# Patient Record
Sex: Male | Born: 1975 | Race: White | Hispanic: No | Marital: Single | Smoking: Former smoker
Health system: Southern US, Community
[De-identification: ages and names within clinical notes are randomized; demographics above are authoritative.]

## PROBLEM LIST (undated history)

## (undated) DIAGNOSIS — D6851 Activated protein C resistance: Secondary | ICD-10-CM

## (undated) DIAGNOSIS — M549 Dorsalgia, unspecified: Secondary | ICD-10-CM

## (undated) DIAGNOSIS — I341 Nonrheumatic mitral (valve) prolapse: Secondary | ICD-10-CM

## (undated) DIAGNOSIS — F419 Anxiety disorder, unspecified: Secondary | ICD-10-CM

## (undated) HISTORY — PX: INGUINAL HERNIA REPAIR: SUR1180

---

## 2008-01-17 ENCOUNTER — Ambulatory Visit: Payer: Self-pay | Admitting: Internal Medicine

## 2009-09-06 ENCOUNTER — Emergency Department: Payer: Self-pay | Admitting: Emergency Medicine

## 2009-09-06 ENCOUNTER — Ambulatory Visit: Payer: Self-pay | Admitting: Internal Medicine

## 2009-11-21 ENCOUNTER — Ambulatory Visit: Payer: Self-pay | Admitting: Internal Medicine

## 2010-09-30 ENCOUNTER — Ambulatory Visit: Payer: Self-pay | Admitting: Family Medicine

## 2011-04-28 ENCOUNTER — Ambulatory Visit: Payer: Self-pay | Admitting: Internal Medicine

## 2011-09-02 ENCOUNTER — Ambulatory Visit: Payer: Self-pay

## 2012-01-08 ENCOUNTER — Ambulatory Visit: Payer: Self-pay

## 2012-05-12 ENCOUNTER — Ambulatory Visit: Payer: Self-pay | Admitting: Medical

## 2012-10-16 ENCOUNTER — Ambulatory Visit: Payer: Self-pay | Admitting: Physician Assistant

## 2012-10-19 ENCOUNTER — Ambulatory Visit: Payer: Self-pay | Admitting: Family Medicine

## 2012-10-21 ENCOUNTER — Emergency Department: Payer: Self-pay | Admitting: Emergency Medicine

## 2013-08-03 ENCOUNTER — Emergency Department: Payer: Self-pay | Admitting: Emergency Medicine

## 2013-08-03 LAB — COMPREHENSIVE METABOLIC PANEL
Alkaline Phosphatase: 101 U/L (ref 50–136)
Anion Gap: 6 — ABNORMAL LOW (ref 7–16)
BUN: 11 mg/dL (ref 7–18)
Bilirubin,Total: 0.3 mg/dL (ref 0.2–1.0)
Calcium, Total: 8.9 mg/dL (ref 8.5–10.1)
Co2: 25 mmol/L (ref 21–32)
EGFR (African American): >60
Glucose: 126 mg/dL — ABNORMAL HIGH (ref 65–99)
Osmolality: 277 (ref 275–301)
Potassium: 3.4 mmol/L — ABNORMAL LOW (ref 3.5–5.1)
SGPT (ALT): 21 U/L (ref 12–78)
Total Protein: 6.9 g/dL (ref 6.4–8.2)

## 2013-08-03 LAB — CK TOTAL AND CKMB (NOT AT ARMC)
CK, Total: 67 U/L (ref 35–232)
CK-MB: 1 ng/mL (ref 0.5–3.6)

## 2013-08-03 LAB — PROTIME-INR: Prothrombin Time: 12.5 secs (ref 11.5–14.7)

## 2013-08-03 LAB — CBC
MCH: 31.1 pg (ref 26.0–34.0)
MCHC: 34.4 g/dL (ref 32.0–36.0)
MCV: 90 fL (ref 80–100)
Platelet: 281 10*3/uL (ref 150–440)
RBC: 4.74 10*6/uL (ref 4.40–5.90)
WBC: 7.5 10*3/uL (ref 3.8–10.6)

## 2013-12-30 ENCOUNTER — Emergency Department: Payer: Self-pay | Admitting: Emergency Medicine

## 2015-06-12 ENCOUNTER — Emergency Department
Admission: EM | Admit: 2015-06-12 | Discharge: 2015-06-12 | Disposition: A | Discharge status: Home / Self Care | Payer: Self-pay | Attending: Emergency Medicine | Admitting: Emergency Medicine

## 2015-06-12 ENCOUNTER — Encounter: Payer: Self-pay | Admitting: Emergency Medicine

## 2015-06-12 ENCOUNTER — Emergency Department: Payer: Self-pay

## 2015-06-12 ENCOUNTER — Other Ambulatory Visit: Payer: Self-pay

## 2015-06-12 DIAGNOSIS — R079 Chest pain, unspecified: Secondary | ICD-10-CM | POA: Insufficient documentation

## 2015-06-12 DIAGNOSIS — Z87891 Personal history of nicotine dependence: Secondary | ICD-10-CM | POA: Insufficient documentation

## 2015-06-12 DIAGNOSIS — F419 Anxiety disorder, unspecified: Secondary | ICD-10-CM | POA: Insufficient documentation

## 2015-06-12 HISTORY — DX: Anxiety disorder, unspecified: F41.9

## 2015-06-12 HISTORY — DX: Dorsalgia, unspecified: M54.9

## 2015-06-12 HISTORY — DX: Nonrheumatic mitral (valve) prolapse: I34.1

## 2015-06-12 LAB — TROPONIN I
Troponin I: <0.03 ng/mL (ref <0.031)
Troponin I: <0.03 ng/mL (ref <0.031)

## 2015-06-12 LAB — CBC WITH DIFFERENTIAL/PLATELET
Basophils Absolute: 0 10*3/uL (ref 0–0.1)
Basophils Relative: 0 %
Eosinophils Absolute: 0 10*3/uL (ref 0–0.7)
Eosinophils Relative: 0 %
HEMATOCRIT: 43.8 % (ref 40.0–52.0)
HEMOGLOBIN: 14.6 g/dL (ref 13.0–18.0)
LYMPHS ABS: 0.7 10*3/uL — AB (ref 1.0–3.6)
LYMPHS PCT: 5 %
MCH: 30.1 pg (ref 26.0–34.0)
MCHC: 33.4 g/dL (ref 32.0–36.0)
MCV: 90.1 fL (ref 80.0–100.0)
MONOS PCT: 5 %
Monocytes Absolute: 0.6 10*3/uL (ref 0.2–1.0)
NEUTROS ABS: 12.5 10*3/uL — AB (ref 1.4–6.5)
NEUTROS PCT: 90 %
Platelets: 287 10*3/uL (ref 150–440)
RBC: 4.86 MIL/uL (ref 4.40–5.90)
RDW: 12.7 % (ref 11.5–14.5)
WBC: 13.9 10*3/uL — AB (ref 3.8–10.6)

## 2015-06-12 LAB — CBC
HEMATOCRIT: 44.1 % (ref 40.0–52.0)
HEMOGLOBIN: 14.7 g/dL (ref 13.0–18.0)
MCH: 30.3 pg (ref 26.0–34.0)
MCHC: 33.5 g/dL (ref 32.0–36.0)
MCV: 90.6 fL (ref 80.0–100.0)
Platelets: 294 10*3/uL (ref 150–440)
RBC: 4.86 MIL/uL (ref 4.40–5.90)
RDW: 12.9 % (ref 11.5–14.5)
WBC: 14.4 10*3/uL — AB (ref 3.8–10.6)

## 2015-06-12 LAB — BASIC METABOLIC PANEL
ANION GAP: 9 (ref 5–15)
BUN: 14 mg/dL (ref 6–20)
CHLORIDE: 105 mmol/L (ref 101–111)
CO2: 24 mmol/L (ref 22–32)
Calcium: 9.4 mg/dL (ref 8.9–10.3)
Creatinine, Ser: 1 mg/dL (ref 0.61–1.24)
GFR calc Af Amer: >60 mL/min (ref >60)
Glucose, Bld: 125 mg/dL — ABNORMAL HIGH (ref 65–99)
POTASSIUM: 3.9 mmol/L (ref 3.5–5.1)
SODIUM: 138 mmol/L (ref 135–145)

## 2015-06-12 LAB — FIBRIN DERIVATIVES D-DIMER (ARMC ONLY): FIBRIN DERIVATIVES D-DIMER (ARMC): 232 (ref 0–499)

## 2015-06-12 MED ORDER — LORAZEPAM 2 MG/ML IJ SOLN
0.5000 mg | Freq: Once | INTRAMUSCULAR | Status: AC
  Administered 2015-06-12: 0.5 mg via INTRAVENOUS
  Filled 2015-06-12: qty 1

## 2015-06-12 NOTE — ED Provider Notes (Signed)
[facility] Emergency Department Provider Note  ____________________________________________  Time seen: Approximately 8:03 AM  I have reviewed the triage vital signs and the nursing notes.   HISTORY  Chief Complaint Chest Pain  History is difficult to obtain because patient mumbles and will not speak loud also he appears to be extremely anxious  HPI Rodney Perry is a 39 y.o. male who was at work when as I understand it the fire alarm went off this made him develop sharp stabbing chest pain in his left chest. It does not appear to radiate anywhere. He reports he is somewhat nauseated maybe a little bit short of breath at times as well. Patient reports the pain is sharp stabbing pain last seconds and goes away comes back immediately. This been going on for approximately an hour. I am Really unable to understand if this pain is ever happened before. I cannot determine if anything makes this pain better or worse except for the deep breathing seems to make it worse. Patient cannot tell me if it gets worse with exercise okaying it did not appear to be any other associated symptoms going on patient does not appear to have severe pain at this point   Past Medical History  Diagnosis Date  . Anxiety   . Back pain   . Mitral valve prolapse     There are no active problems to display for this patient.   Past Surgical History  Procedure Laterality Date  . Inguinal hernia repair      No current outpatient prescriptions on file.  Allergies Review of patient's allergies indicates no known allergies.  History reviewed. No pertinent family history.  Social History Social History  Substance Use Topics  . Smoking status: Former Games developer  . Smokeless tobacco: None  . Alcohol Use: No    Review of Systems Constitutional: No fever/chills Eyes: No visual changes. ENT: No sore throat. Cardiovascular: See history of present illness Respiratory: See history of  present illness. Gastrointestinal: No abdominal pain.  No nausea, no vomiting.  No diarrhea.  No constipation.  Musculoskeletal: See past medical history Skin: Negative for rash.  10-point ROS otherwise negative.  ____________________________________________   PHYSICAL EXAM:  VITAL SIGNS: ED Triage Vitals  Enc Vitals Group     BP 06/12/15 0743 118/86 mmHg     Pulse Rate 06/12/15 0743 115     Resp 06/12/15 0743 28     Temp 06/12/15 0743 98.4 F (36.9 C)     Temp Source 06/12/15 0743 Oral     SpO2 06/12/15 0743 100 %     Weight 06/12/15 0743 130 lb (58.968 kg)     Height 06/12/15 0743  (1.651 m)     Head Cir --      Peak Flow --      Pain Score 06/12/15 0745 5     Pain Loc --      Pain Edu? --      Excl. in GC? --     Constitutional: Alert and oriented. Appears anxious. Eyes: Conjunctivae are normal. PERRL. EOMI. Head: Atraumatic. Nose: No congestion/rhinnorhea. Mouth/Throat: Mucous membranes are moist.   Neck: No stridor. Cardiovascular: Normal rate, regular rhythm. Grossly normal heart sounds.  Good peripheral circulation. Respiratory: Normal respiratory effort.  No retractions. Lungs CTAB there are no rubs. Near as I can tell the palpation of the patient's chest reproduces the pain but the patient is very hesitant and really does not seem sure himself Gastrointestinal: Soft  and nontender. No distention. No abdominal bruits. No CVA tenderness. Musculoskeletal: No lower extremity tenderness trace edema bilaterally.  No joint effusions. Neurologic:  Normal speech and language. No gross focal neurologic deficits are appreciated.  Skin:  Skin is warm, dry and intact. No rash noted.   ____________________________________________   LABS (all labs ordered are listed, but only abnormal results are displayed)  Labs Reviewed  BASIC METABOLIC PANEL - Abnormal; Notable for the following:    Glucose, Bld 125 (*)    All other components within normal limits  CBC -  Abnormal; Notable for the following:    WBC 14.4 (*)    All other components within normal limits  CBC WITH DIFFERENTIAL/PLATELET - Abnormal; Notable for the following:    WBC 13.9 (*)    Neutro Abs 12.5 (*)    Lymphs Abs 0.7 (*)    All other components within normal limits  TROPONIN I  FIBRIN DERIVATIVES D-DIMER ([facility] ONLY)  TROPONIN I   ____________________________________________  EKG  EKG shows normal sinus rhythm normal axis QRS duration is 0.92 ms. Computer is reading incomplete right bundle branch block and think because there is no RSR prime in lead 1. Patient becomes increasingly minimally tachycardic as I talk to him and palpate his chest. Her rate goes from approximately 103-111 in the course the examination is back down and some leaving the room. ____________________________________________  RADIOLOGY  Radiology reads the film was increased. Bronchial cuffing. He reports that this has been present in the past. ____________________________________________   PROCEDURES   ____________________________________________   INITIAL IMPRESSION / ASSESSMENT AND PLAN / ED COURSE  Pertinent labs & imaging results that were available during my care of the patient were reviewed by me and considered in my medical decision making (see chart for details). Patient's d-dimer is negative patient's troponins are negative patient's pain is more pleuritic in nature. I will have her follow up with cardiology.  ____________________________________________   FINAL CLINICAL IMPRESSION(S) / ED DIAGNOSES  Final diagnoses:  Chest pain, unspecified chest pain type      Arnaldo Natal, MD 06/12/15 1234

## 2015-06-12 NOTE — ED Notes (Signed)
Patient transported to X-ray 

## 2015-06-12 NOTE — Discharge Instructions (Signed)
Chest Pain (Nonspecific) °It is often hard to give a specific diagnosis for the cause of chest pain. There is always a chance that your pain could be related to something serious, such as a heart attack or a blood clot in the lungs. You need to follow up with your health care provider for further evaluation. °CAUSES  °· Heartburn. °· Pneumonia or bronchitis. °· Anxiety or stress. °· Inflammation around your heart (pericarditis) or lung (pleuritis or pleurisy). °· A blood clot in the lung. °· A collapsed lung (pneumothorax). It can develop suddenly on its own (spontaneous pneumothorax) or from trauma to the chest. °· Shingles infection (herpes zoster virus). °The chest wall is composed of bones, muscles, and cartilage. Any of these can be the source of the pain. °· The bones can be bruised by injury. °· The muscles or cartilage can be strained by coughing or overwork. °· The cartilage can be affected by inflammation and become sore (costochondritis). °DIAGNOSIS  °Lab tests or other studies may be needed to find the cause of your pain. Your health care provider may have you take a test called an ambulatory electrocardiogram (ECG). An ECG records your heartbeat patterns over a 24-hour period. You may also have other tests, such as: °· Transthoracic echocardiogram (TTE). During echocardiography, sound waves are used to evaluate how blood flows through your heart. °· Transesophageal echocardiogram (TEE). °· Cardiac monitoring. This allows your health care provider to monitor your heart rate and rhythm in real time. °· Holter monitor. This is a portable device that records your heartbeat and can help diagnose heart arrhythmias. It allows your health care provider to track your heart activity for several days, if needed. °· Stress tests by exercise or by giving medicine that makes the heart beat faster. °TREATMENT  °· Treatment depends on what may be causing your chest pain. Treatment may include: °¨ Acid blockers for  heartburn. °¨ Anti-inflammatory medicine. °¨ Pain medicine for inflammatory conditions. °¨ Antibiotics if an infection is present. °· You may be advised to change lifestyle habits. This includes stopping smoking and avoiding alcohol, caffeine, and chocolate. °· You may be advised to keep your head raised (elevated) when sleeping. This reduces the chance of acid going backward from your stomach into your esophagus. °Most of the time, nonspecific chest pain will improve within 2-3 days with rest and mild pain medicine.  °HOME CARE INSTRUCTIONS  °· If antibiotics were prescribed, take them as directed. Finish them even if you start to feel better. °· For the next few days, avoid physical activities that bring on chest pain. Continue physical activities as directed. °· Do not use any tobacco products, including cigarettes, chewing tobacco, or electronic cigarettes. °· Avoid drinking alcohol. °· Only take medicine as directed by your health care provider. °· Follow your health care provider's suggestions for further testing if your chest pain does not go away. °· Keep any follow-up appointments you made. If you do not go to an appointment, you could develop lasting (chronic) problems with pain. If there is any problem keeping an appointment, call to reschedule. °SEEK MEDICAL CARE IF:  °· Your chest pain does not go away, even after treatment. °· You have a rash with blisters on your chest. °· You have a fever. °SEEK IMMEDIATE MEDICAL CARE IF:  °· You have increased chest pain or pain that spreads to your arm, neck, jaw, back, or abdomen. °· You have shortness of breath. °· You have an increasing cough, or you cough   up blood.  You have severe back or abdominal pain.  You feel nauseous or vomit.  You have severe weakness.  You faint.  You have chills. This is an emergency. Do not wait to see if the pain will go away. Get medical help at once. Call your local emergency services (911 in [place] Do not drive  yourself to the hospital. MAKE SURE YOU:   Understand these instructions.  Will watch your condition.  Will get help right away if you are not doing well or get worse. Document Released: 07/01/2005 Document Revised: 09/26/2013 Document Reviewed: 04/26/2008 [facility] Patient Information 2015 [place], [place]. This information is not intended to replace advice given to you by your health care provider. Make sure you discuss any questions you have with your health care provider. Please follow-up with Dr. coloscopy the cardiologist call his office later today for an appointment probably tomorrow. Please return for fever or productive cough or feeling worse.

## 2016-11-16 ENCOUNTER — Encounter: Payer: Self-pay | Admitting: Emergency Medicine

## 2016-11-16 ENCOUNTER — Emergency Department
Admission: EM | Admit: 2016-11-16 | Discharge: 2016-11-16 | Disposition: A | Discharge status: Home / Self Care | Payer: Self-pay | Attending: Emergency Medicine | Admitting: Emergency Medicine

## 2016-11-16 ENCOUNTER — Emergency Department: Payer: Self-pay

## 2016-11-16 DIAGNOSIS — Z87891 Personal history of nicotine dependence: Secondary | ICD-10-CM | POA: Insufficient documentation

## 2016-11-16 DIAGNOSIS — R55 Syncope and collapse: Secondary | ICD-10-CM | POA: Insufficient documentation

## 2016-11-16 HISTORY — DX: Activated protein C resistance: D68.51

## 2016-11-16 LAB — CBC
HCT: 43.2 % (ref 40.0–52.0)
Hemoglobin: 15.3 g/dL (ref 13.0–18.0)
MCH: 32 pg (ref 26.0–34.0)
MCHC: 35.5 g/dL (ref 32.0–36.0)
MCV: 90.3 fL (ref 80.0–100.0)
PLATELETS: 359 10*3/uL (ref 150–440)
RBC: 4.78 MIL/uL (ref 4.40–5.90)
RDW: 13.3 % (ref 11.5–14.5)
WBC: 10.3 10*3/uL (ref 3.8–10.6)

## 2016-11-16 LAB — COMPREHENSIVE METABOLIC PANEL
ALBUMIN: 4.4 g/dL (ref 3.5–5.0)
ALT: 13 U/L — ABNORMAL LOW (ref 17–63)
ANION GAP: 7 (ref 5–15)
AST: 20 U/L (ref 15–41)
Alkaline Phosphatase: 101 U/L (ref 38–126)
BUN: 16 mg/dL (ref 6–20)
CALCIUM: 9.2 mg/dL (ref 8.9–10.3)
CHLORIDE: 104 mmol/L (ref 101–111)
CO2: 24 mmol/L (ref 22–32)
Creatinine, Ser: 1.26 mg/dL — ABNORMAL HIGH (ref 0.61–1.24)
GFR calc non Af Amer: >60 mL/min (ref >60)
Glucose, Bld: 150 mg/dL — ABNORMAL HIGH (ref 65–99)
POTASSIUM: 4.1 mmol/L (ref 3.5–5.1)
Sodium: 135 mmol/L (ref 135–145)
Total Bilirubin: 0.7 mg/dL (ref 0.3–1.2)
Total Protein: 7.4 g/dL (ref 6.5–8.1)

## 2016-11-16 LAB — TROPONIN I
Troponin I: <0.03 ng/mL (ref <0.03)
Troponin I: <0.03 ng/mL (ref <0.03)

## 2016-11-16 MED ORDER — SODIUM CHLORIDE 0.9 % IV BOLUS (SEPSIS)
1000.0000 mL | Freq: Once | INTRAVENOUS | Status: AC
  Administered 2016-11-16: 1000 mL via INTRAVENOUS

## 2016-11-16 NOTE — ED Notes (Signed)
Pt. Able to get up from bed and ambulate to toilet.

## 2016-11-16 NOTE — ED Triage Notes (Signed)
Pt arrives via [facility] with c/o syncopal episode at work. Pt states that he was standing at work and passed out hitting his head on the monitor. Pt also reports that he almost passed out a second time. Per EMS, pt has CBG of 220 and VS WDL. Pt is in NAD at this time.

## 2016-11-16 NOTE — ED Notes (Signed)
Pt. Going home with mother. 

## 2016-11-16 NOTE — ED Provider Notes (Signed)
[facility] Emergency Department Provider Note   ____________________________________________   First MD Initiated Contact with Patient 11/16/16 0117     (approximate)  I have reviewed the triage vital signs and the nursing notes.   HISTORY  Chief Complaint Loss of Consciousness    HPI Rodney Perry is a 41 y.o. male who comes into the hospital today not feeling well. He reports that he was at work standing when he started feeling lightheaded and sweaty. He reports he passed out. He woke up when he hit his head on the monitor. He states that he started feeling like a pass out again but he sat down before he passed out. The patient reports that he is unsure exactly how long he was out for. He states that he has been eating and drinking well today. He reports that he had no chest pain no shortness of breath no nausea vomiting or dizziness. He said the last time something like this happen in his heart was racing but he didn't pass out. He has never passed out before. The patient has a history of factor V Leiden but he takes aspirin as never had any clots before. The patient was at work when this occurred. He is here today for evaluation.   Past Medical History:  Diagnosis Date  . Anxiety   . Back pain   . Factor V Leiden (HCC)   . Mitral valve prolapse     There are no active problems to display for this patient.   Past Surgical History:  Procedure Laterality Date  . INGUINAL HERNIA REPAIR      Prior to Admission medications   Not on File    Allergies Patient has no known allergies.  No family history on file.  Social History Social History  Substance Use Topics  . Smoking status: Former Games developer  . Smokeless tobacco: Never Used  . Alcohol use No    Review of Systems Constitutional: No fever/chills Eyes: No visual changes. ENT: No sore throat. Cardiovascular: Denies chest pain. Respiratory: Denies shortness of  breath. Gastrointestinal: No abdominal pain.  No nausea, no vomiting.  No diarrhea.  No constipation. Genitourinary: Negative for dysuria. Musculoskeletal: Negative for back pain. Skin: Negative for rash. Neurological: Syncope, dizziness, lightheadedness.  10-point ROS otherwise negative.  ____________________________________________   PHYSICAL EXAM:  VITAL SIGNS: ED Triage Vitals [11/16/16 0125]  Enc Vitals Group     BP 130/67     Pulse Rate 93     Resp 15     Temp 98.4 F (36.9 C)     Temp Source Oral     SpO2 100 %     Weight 135 lb (61.2 kg)     Height 5\' 5"  (1.651 m)     Head Circumference      Peak Flow      Pain Score 1     Pain Loc      Pain Edu?      Excl. in GC?     Constitutional: Alert and oriented. Well appearing and in no acute distress. Eyes: Conjunctivae are normal. PERRL. EOMI. Head: Atraumatic. Nose: No congestion/rhinnorhea. Mouth/Throat: Mucous membranes are moist.  Oropharynx non-erythematous. Cardiovascular: Normal rate, regular rhythm. Grossly normal heart sounds.  Good peripheral circulation. Respiratory: Normal respiratory effort.  No retractions. Lungs CTAB. Gastrointestinal: Soft and nontender. No distention. Positive bowel sounds Musculoskeletal: No lower extremity tenderness nor edema.   Neurologic:  Normal speech and language. Cranial nerves II through XII  are grossly intact with no focal motor or neuro deficits Skin:  Skin is warm, dry and intact.  Psychiatric: Mood and affect are normal.   ____________________________________________   LABS (all labs ordered are listed, but only abnormal results are displayed)  Labs Reviewed  COMPREHENSIVE METABOLIC PANEL - Abnormal; Notable for the following:       Result Value   Glucose, Bld 150 (*)    Creatinine, Ser 1.26 (*)    ALT 13 (*)    All other components within normal limits  CBC  TROPONIN I  TROPONIN I   ____________________________________________  EKG  ED ECG REPORT I,  Rebecka ApleyWebster,  Ruqayyah Lute P, the attending physician, personally viewed and interpreted this ECG.   Date: 11/16/2016  EKG Time: 120  Rate: 98  Rhythm: normal sinus rhythm  Axis: normal  Intervals:none  ST&T Change: none  ____________________________________________  RADIOLOGY  CT head  ____________________________________________   PROCEDURES  Procedure(s) performed: None  Procedures  Critical Care performed: No  ____________________________________________   INITIAL IMPRESSION / ASSESSMENT AND PLAN / ED COURSE  Pertinent labs & imaging results that were available during my care of the patient were reviewed by me and considered in my medical decision making (see chart for details).  This is a 41 year old male who comes into the hospital today with syncope. The patient was standing at work when he passed out. He reports that he was feeling unwell but when I asked him what that meant he said that he felt a little lightheaded. He states that he did eat and drink well but he was not fully sure. I did give the patient liter of normal saline. Also check some blood work. The patient's blood work is unremarkable. He has no complaints at this time. He has some mildly elevated creatinine which could indicate some mild dehydration. After giving the patient fluids reports that he does feel little bit better. He'll be discharged home to follow-up with the acute care clinic.  Clinical Course as of Nov 16 624  Mon Nov 16, 2016  0219 No acute intracranial abnormalities. CT Head Wo Contrast [AW]    Clinical Course User Index [AW] Rebecka ApleyAllison P Iliyah Bui, MD     ____________________________________________   FINAL CLINICAL IMPRESSION(S) / ED DIAGNOSES  Final diagnoses:  Syncope, unspecified syncope type      NEW MEDICATIONS STARTED DURING THIS VISIT:  New Prescriptions   No medications on file     Note:  This document was prepared using Dragon voice recognition software and may  include unintentional dictation errors.    Rebecka ApleyAllison P Antoneo Ghrist, MD 11/16/16 [phone]

## 2016-11-16 NOTE — ED Notes (Signed)
Pt. States at around 23:00 last night pt. Had a syncope while sitting at desk.  Pt. States he was able to stand and get some water.  Pt. States when he sat back down he was dizzy again.  Pt. Denies hx of same.

## 2018-02-01 IMAGING — CT CT HEAD W/O CM
3 series · 16 of 47 positions shown, 19 images · non-contrast
Comparison: 12/31/2013

CLINICAL DATA: Syncopal episode. Struck head on monitor. Almost
passed out a second time.

EXAM:
CT HEAD WITHOUT CONTRAST
TECHNIQUE: Contiguous axial images were obtained from the base of the skull
through the vertex without intravenous contrast.

[Series 2: head wo · axial · 0.41mm/px · z∈[-157,-32]mm · 10 of 30 slices shown, 13 images]
[im 3/30  brain]
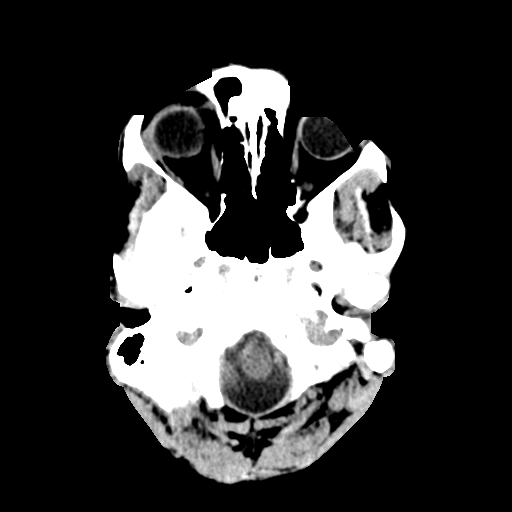
[im 3/30  bone]
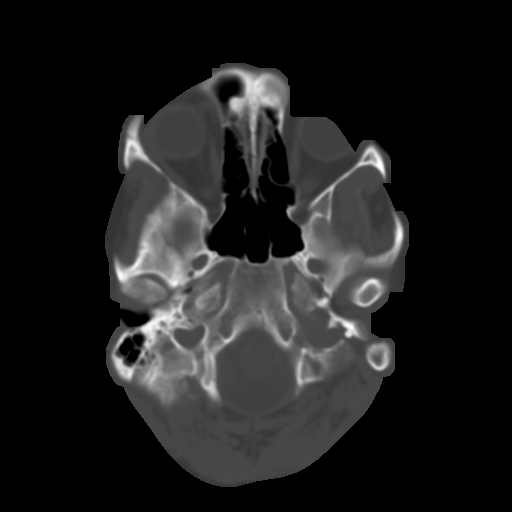
[im 6/30  brain]
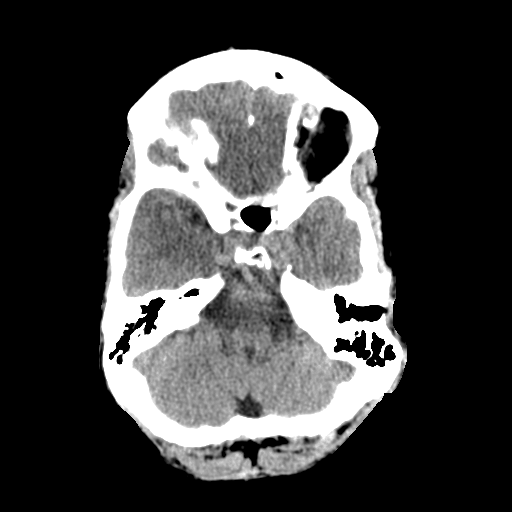
[im 9/30  brain]
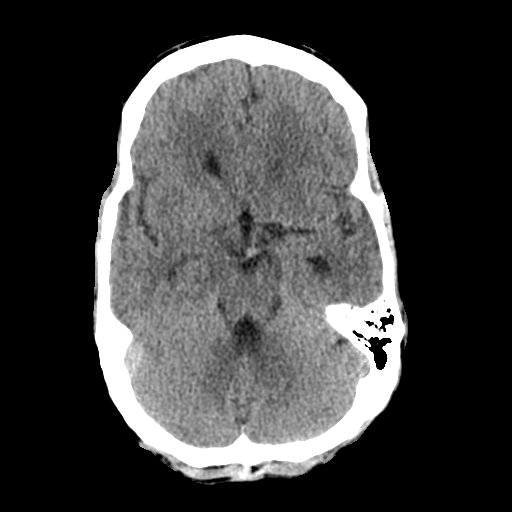
[im 11/30  brain]
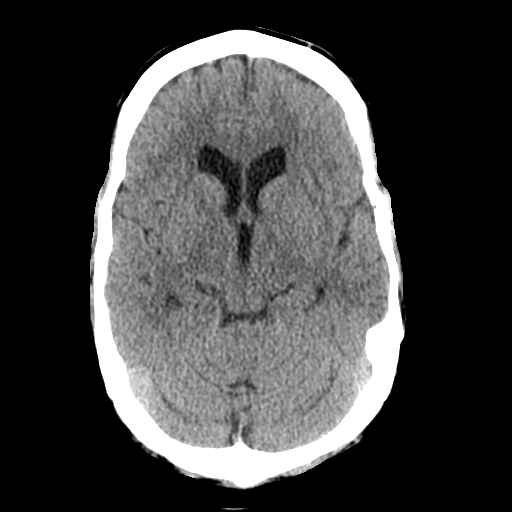
[im 14/30  brain]
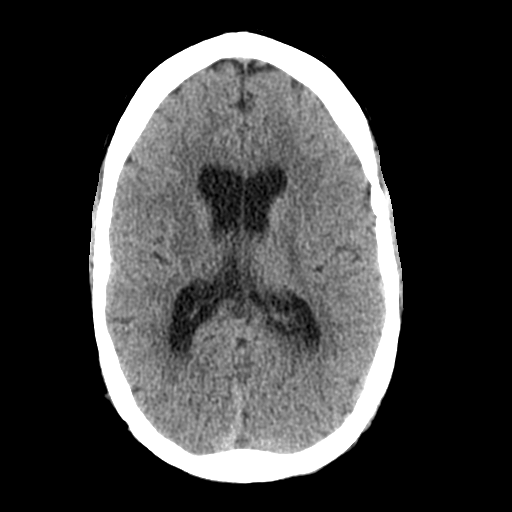
[im 14/30  bone]
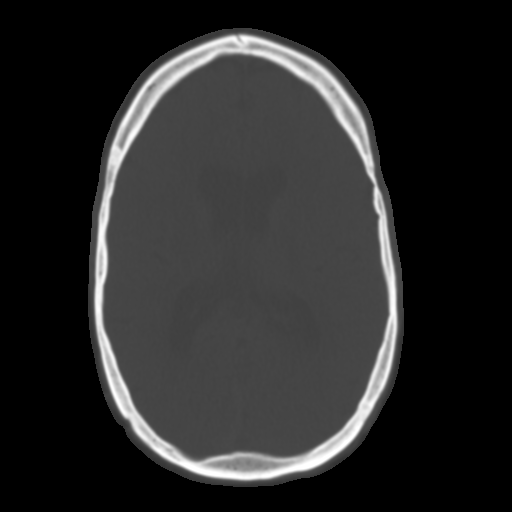
[im 17/30  brain]
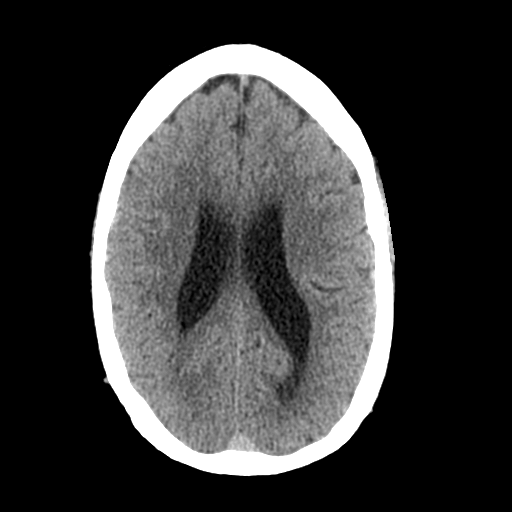
[im 20/30  brain]
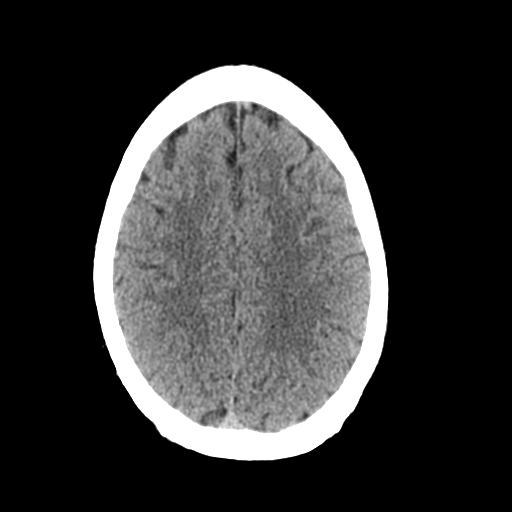
[im 23/30  brain]
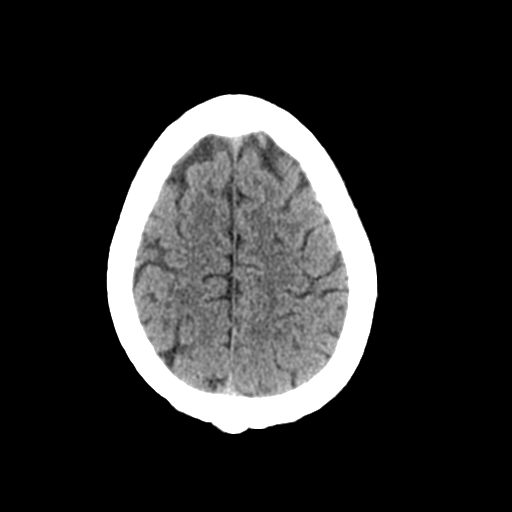
[im 25/30  brain]
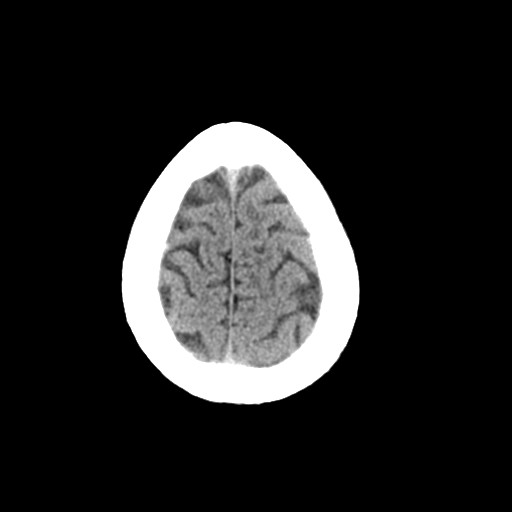
[im 25/30  bone]
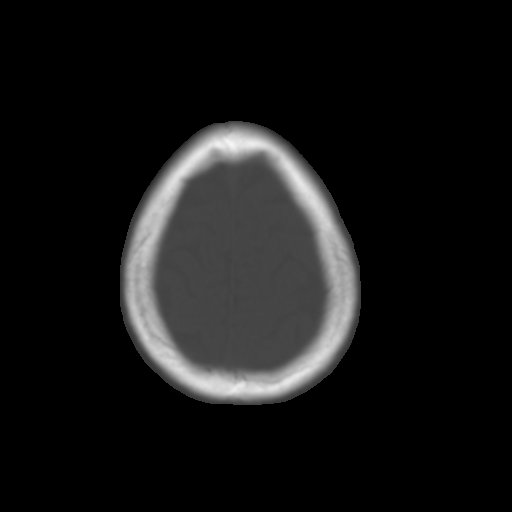
[im 28/30  brain]
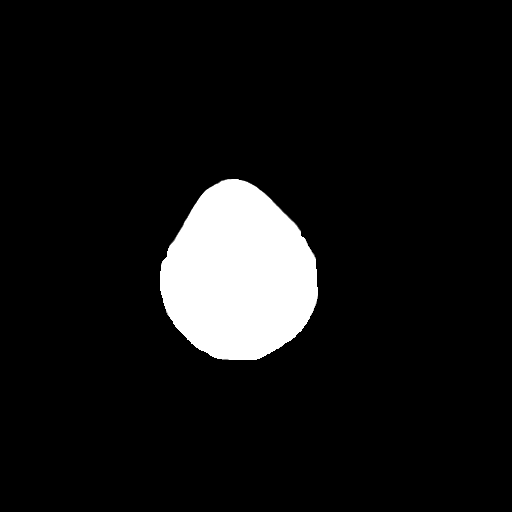

[Series 4: coronal soft tissue · coronal · 0.28mm/px · 3 of 65 slices shown]
[im 22/65  brain]
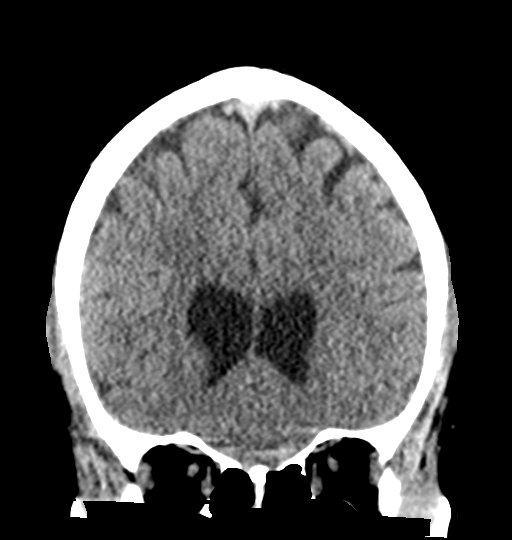
[im 29/65  brain]
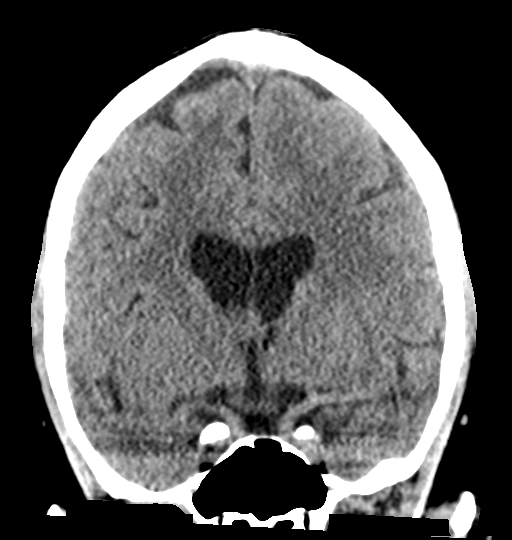
[im 36/65  brain]
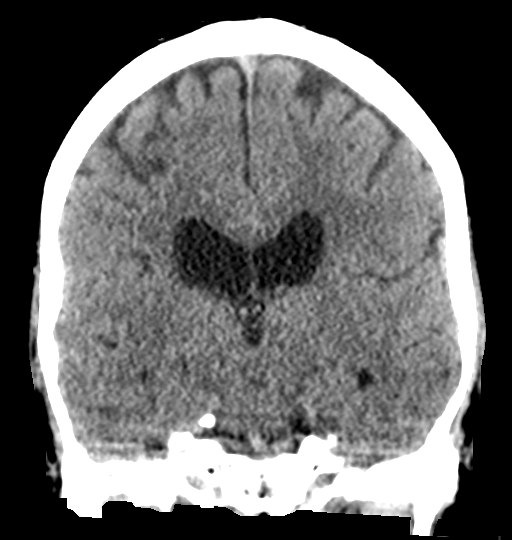

[Series 5: sagittal soft tissue · sagittal · 0.30mm/px · 3 of 49 slices shown]
[im 17/49  brain]
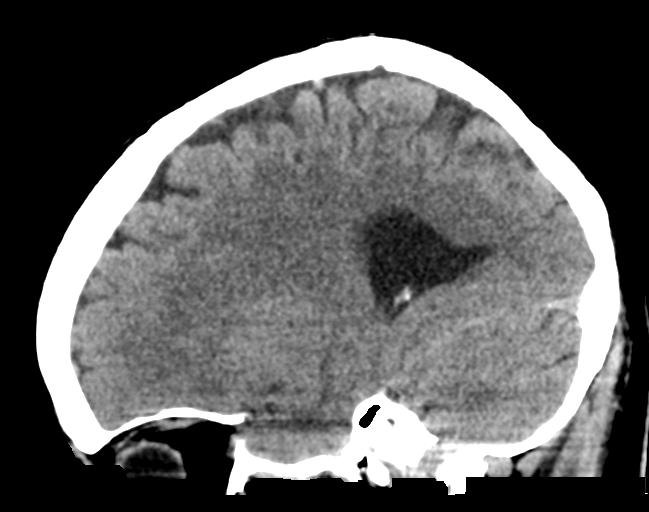
[im 25/49  brain]
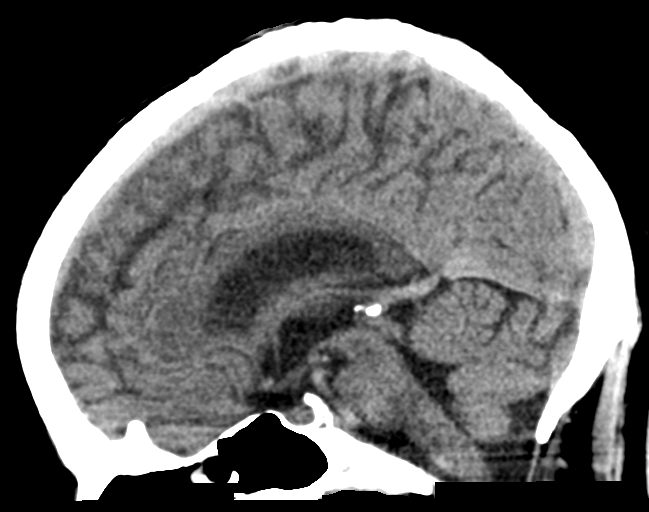
[im 33/49  brain]
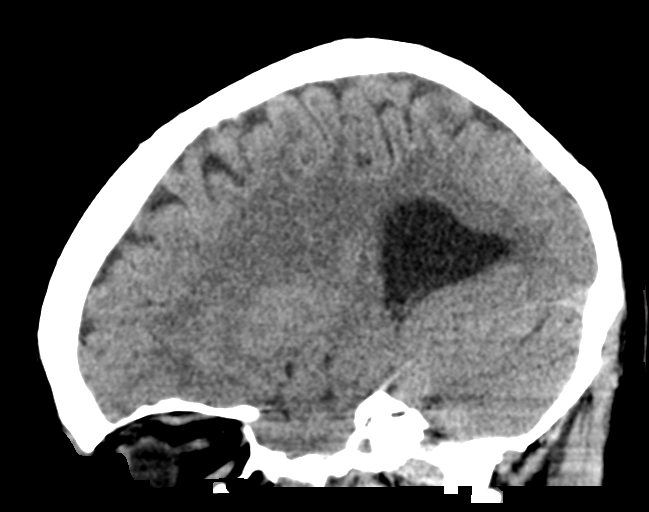

[16 of 47 positions shown; findings below may reference images not displayed]

FINDINGS: Brain: No evidence of acute infarction, hemorrhage, hydrocephalus,
extra-axial collection or mass lesion/mass effect.

Vascular: No hyperdense vessel or unexpected calcification.

Skull: Normal. Negative for fracture or focal lesion.

Sinuses/Orbits: No acute finding.

Other: No significant changes since previous study.
IMPRESSION: No acute intracranial abnormalities.

## 2022-10-22 ENCOUNTER — Emergency Department
Admission: EM | Admit: 2022-10-22 | Discharge: 2022-10-22 | Disposition: A | Discharge status: Home / Self Care | Payer: Medicaid Other | Attending: Emergency Medicine | Admitting: Emergency Medicine

## 2022-10-22 ENCOUNTER — Emergency Department: Payer: Medicaid Other

## 2022-10-22 ENCOUNTER — Other Ambulatory Visit: Payer: Self-pay

## 2022-10-22 DIAGNOSIS — L03115 Cellulitis of right lower limb: Secondary | ICD-10-CM | POA: Diagnosis not present

## 2022-10-22 DIAGNOSIS — M7989 Other specified soft tissue disorders: Secondary | ICD-10-CM | POA: Diagnosis present

## 2022-10-22 LAB — CBC WITH DIFFERENTIAL/PLATELET
Abs Immature Granulocytes: 0.02 10*3/uL (ref 0.00–0.07)
Basophils Absolute: 0 10*3/uL (ref 0.0–0.1)
Basophils Relative: 0 %
Eosinophils Absolute: 0.2 10*3/uL (ref 0.0–0.5)
Eosinophils Relative: 3 %
HCT: 42 % (ref 39.0–52.0)
Hemoglobin: 14 g/dL (ref 13.0–17.0)
Immature Granulocytes: 0 %
Lymphocytes Relative: 21 %
Lymphs Abs: 1.5 10*3/uL (ref 0.7–4.0)
MCH: 30.4 pg (ref 26.0–34.0)
MCHC: 33.3 g/dL (ref 30.0–36.0)
MCV: 91.1 fL (ref 80.0–100.0)
Monocytes Absolute: 0.5 10*3/uL (ref 0.1–1.0)
Monocytes Relative: 8 %
Neutro Abs: 4.8 10*3/uL (ref 1.7–7.7)
Neutrophils Relative %: 68 %
Platelets: 328 10*3/uL (ref 150–400)
RBC: 4.61 MIL/uL (ref 4.22–5.81)
RDW: 13.2 % (ref 11.5–15.5)
WBC: 7 10*3/uL (ref 4.0–10.5)
nRBC: 0 % (ref 0.0–0.2)

## 2022-10-22 LAB — COMPREHENSIVE METABOLIC PANEL
ALT: 20 U/L (ref 0–44)
AST: 20 U/L (ref 15–41)
Albumin: 3.7 g/dL (ref 3.5–5.0)
Alkaline Phosphatase: 83 U/L (ref 38–126)
Anion gap: 4 — ABNORMAL LOW (ref 5–15)
BUN: 15 mg/dL (ref 6–20)
CO2: 25 mmol/L (ref 22–32)
Calcium: 8.6 mg/dL — ABNORMAL LOW (ref 8.9–10.3)
Chloride: 106 mmol/L (ref 98–111)
Creatinine, Ser: 1.1 mg/dL (ref 0.61–1.24)
GFR, Estimated: >60 mL/min (ref >60)
Glucose, Bld: 94 mg/dL (ref 70–99)
Potassium: 3.9 mmol/L (ref 3.5–5.1)
Sodium: 135 mmol/L (ref 135–145)
Total Bilirubin: 0.4 mg/dL (ref 0.3–1.2)
Total Protein: 6.6 g/dL (ref 6.5–8.1)

## 2022-10-22 LAB — LACTIC ACID, PLASMA: Lactic Acid, Venous: 1.2 mmol/L (ref 0.5–1.9)

## 2022-10-22 LAB — PROTIME-INR
INR: 1.1 (ref 0.8–1.2)
Prothrombin Time: 13.7 seconds (ref 11.4–15.2)

## 2022-10-22 MED ORDER — CLINDAMYCIN HCL 300 MG PO CAPS: 300.0000 mg | ORAL_CAPSULE | Freq: Four times a day (QID) | ORAL | 0 refills | Status: AC

## 2022-10-22 MED ORDER — VANCOMYCIN HCL IN DEXTROSE 1-5 GM/200ML-% IV SOLN
1000.0000 mg | Freq: Once | INTRAVENOUS | Status: AC
  Administered 2022-10-22: 1000 mg via INTRAVENOUS
  Filled 2022-10-22: qty 200

## 2022-10-22 NOTE — ED Provider Notes (Signed)
[facility] Provider Note  Patient Contact: 9:19 PM (approximate)   History   Foot Swelling   HPI  Rodney Perry is a 47 y.o. male who presents the emergency department complaining of increasing pain, edema to the right foot and calf region.  Patient was seen by [facility] roughly a week ago, diagnosed with mild cellulitis and put on Keflex.  Patient has had worsening of symptoms and pain.  Patient has factor V Leiden but has never had a DVT, PE or difficulties with bleeding or clotting.  Patient is not on anticoagulation.  Patient has been taking the antibiotics as prescribed but feels that he has had a worsening in his infection.  There is been no trauma to the leg.  No fevers, chills, chest pain, shortness of breath.  No other complaints at this time.     Physical Exam   Triage Vital Signs: ED Triage Vitals [10/22/22 2005]  Enc Vitals Group     BP (!) 149/107     Pulse Rate 92     Resp 18     Temp 97.7 F (36.5 C)     Temp src      SpO2 100 %     Weight 135 lb (61.2 kg)     Height 5\' 5"  (1.651 m)     Head Circumference      Peak Flow      Pain Score 0     Pain Loc      Pain Edu?      Excl. in [place]?     Most recent vital signs: Vitals:   10/22/22 2005 10/22/22 2318  BP: (!) 149/107 (!) 142/97  Pulse: 92 84  Resp: 18 20  Temp: 97.7 F (36.5 C)   SpO2: 100% 100%     General: Alert and in no acute distress.   Cardiovascular:  Good peripheral perfusion Respiratory: Normal respiratory effort without tachypnea or retractions. Lungs CTAB.  Musculoskeletal: Full range of motion to all extremities.  Visualization of the right lower extremity reveals edema and erythema extending from the foot into the calf.  This is slightly edematous and erythematous when compared with left.  There is warmth to the extremity when compared with left.  No open wounds.  No purulent drainage.  No fluctuant regions concerning for underlying abscess.  Pulse and sensation  intact to the right foot. Neurologic:  No gross focal neurologic deficits are appreciated.  Skin:   No rash noted Other:   ED Results / Procedures / Treatments   Labs (all labs ordered are listed, but only abnormal results are displayed) Labs Reviewed  COMPREHENSIVE METABOLIC PANEL - Abnormal; Notable for the following components:      Result Value   Calcium 8.6 (*)    Anion gap 4 (*)    All other components within normal limits  CBC WITH DIFFERENTIAL/PLATELET  PROTIME-INR  LACTIC ACID, PLASMA     EKG     RADIOLOGY  I personally viewed, evaluated, and interpreted these images as part of my medical decision making, as well as reviewing the written report by the radiologist.  ED Provider Interpretation: No evidence of osteomyelitis or retained foreign body on x-ray of the right foot.  Ultrasound reveals no evidence of DVT.  US Venous Img Lower Unilateral Right  Result Date: 10/22/2022 CLINICAL DATA:  Pain edema in right foot and calf EXAM: RIGHT LOWER EXTREMITY VENOUS DOPPLER ULTRASOUND TECHNIQUE: Gray-scale sonography with compression, as well as color  and duplex ultrasound, were performed to evaluate the deep venous system(s) from the level of the common femoral vein through the popliteal and proximal calf veins. COMPARISON:  None Available. FINDINGS: VENOUS Normal compressibility of the common femoral, superficial femoral, and popliteal veins, as well as the visualized calf veins. Visualized portions of profunda femoral vein and great saphenous vein unremarkable. No filling defects to suggest DVT on grayscale or color Doppler imaging. Doppler waveforms show normal direction of venous flow, normal respiratory plasticity and response to augmentation. Limited views of the contralateral common femoral vein are unremarkable. OTHER None. Limitations: none IMPRESSION: 1. No evidence of deep venous thrombosis within the right lower extremity. Electronically Signed   By: Randa Ngo  M.D.   On: 10/22/2022 22:46   DG Foot Complete Right  Result Date: 10/22/2022 CLINICAL DATA:  Edema and pain in the right foot and calf. Right foot swelling for 2 weeks. No known injury. EXAM: RIGHT FOOT COMPLETE - 3+ VIEW COMPARISON:  None Available. FINDINGS: Soft tissue swelling about the right ankle and over the dorsum of the foot. No soft tissue gas or radiopaque foreign bodies demonstrated. Bones appear intact. No evidence of acute fracture or dislocation. No focal bone lesion or bone destruction. Bone cortex appears intact. Joint spaces are normal. IMPRESSION: Soft tissue swelling.  No acute bony abnormalities. Electronically Signed   By: Lucienne Capers M.D.   On: 10/22/2022 22:12    PROCEDURES:  Critical Care performed: No  Procedures   MEDICATIONS ORDERED IN ED: Medications  vancomycin (VANCOCIN) IVPB 1000 mg/200 mL premix (0 mg Intravenous Stopped 10/22/22 2252)     IMPRESSION / MDM / ASSESSMENT AND PLAN / ED COURSE  I reviewed the triage vital signs and the nursing notes.                                 Differential diagnosis includes, but is not limited to, DVT, cellulitis, abscess, osteomyelitis  Patient's presentation is most consistent with acute presentation with potential threat to life or bodily function.   Patient's diagnosis is consistent with cellulitis.  Patient presents emergency department with worsening right foot pain erythema and edema.  Patient has been placed on Keflex earlier this week for cellulitis.  Appears that cellulitis is worsening.  However given the patient's factor V, worsening of the extremity I felt that evaluation with labs as well as ultrasound and x-ray was warranted.  No evidence of osteomyelitis, abscess, DVT.  Labs are overall reassuring.  Feel that patient likely needs stronger coverage with both staph and strep coverage.  Patient will be placed on clindamycin as an outpatient.  Vancomycin given here in the emergency department.  Patient  is stable for discharge at this time.  Follow-up primary care as needed.  Return precautions discussed with the patient.. . Patient is given ED precautions to return to the ED for any worsening or new symptoms.     FINAL CLINICAL IMPRESSION(S) / ED DIAGNOSES   Final diagnoses:  Cellulitis of right lower extremity     Rx / DC Orders   ED Discharge Orders          Ordered    clindamycin (CLEOCIN) 300 MG capsule  4 times daily        10/22/22 2318             Note:  This document was prepared using Dragon voice recognition software and may include  unintentional dictation errors.   Lanette Hampshire 10/22/22 2321    Jene Every, MD 10/25/22 1055

## 2022-10-22 NOTE — ED Notes (Signed)
Report given to [facility] RN

## 2022-10-22 NOTE — ED Triage Notes (Signed)
Right foot swelling x 2 weeks.   Seen at [facility] ER @ [facility] on 1/11. Dx with cellulitis and given Keflex 500mg  QID.   Since then swelling has worsened and says he's unable to wear shoes comfortably now.   Good pedal pulses.

## 2023-10-01 ENCOUNTER — Ambulatory Visit: Payer: Medicaid Other

## 2023-10-01 ENCOUNTER — Other Ambulatory Visit: Payer: Self-pay

## 2023-10-01 ENCOUNTER — Ambulatory Visit
Admission: EM | Admit: 2023-10-01 | Discharge: 2023-10-01 | Disposition: A | Discharge status: Home / Self Care | Payer: Medicaid Other

## 2023-10-01 ENCOUNTER — Ambulatory Visit (INDEPENDENT_AMBULATORY_CARE_PROVIDER_SITE_OTHER): Payer: Medicaid Other

## 2023-10-01 ENCOUNTER — Emergency Department
Admission: EM | Admit: 2023-10-01 | Discharge: 2023-10-02 | Disposition: A | Discharge status: Home / Self Care | Payer: Medicaid Other | Attending: Emergency Medicine | Admitting: Emergency Medicine

## 2023-10-01 ENCOUNTER — Emergency Department: Payer: Medicaid Other

## 2023-10-01 ENCOUNTER — Encounter: Payer: Self-pay | Admitting: Emergency Medicine

## 2023-10-01 DIAGNOSIS — S2231XA Fracture of one rib, right side, initial encounter for closed fracture: Secondary | ICD-10-CM | POA: Diagnosis not present

## 2023-10-01 DIAGNOSIS — X58XXXA Exposure to other specified factors, initial encounter: Secondary | ICD-10-CM | POA: Diagnosis not present

## 2023-10-01 DIAGNOSIS — S29011A Strain of muscle and tendon of front wall of thorax, initial encounter: Secondary | ICD-10-CM

## 2023-10-01 DIAGNOSIS — R0989 Other specified symptoms and signs involving the circulatory and respiratory systems: Secondary | ICD-10-CM | POA: Diagnosis not present

## 2023-10-01 DIAGNOSIS — E871 Hypo-osmolality and hyponatremia: Secondary | ICD-10-CM | POA: Insufficient documentation

## 2023-10-01 DIAGNOSIS — R0789 Other chest pain: Secondary | ICD-10-CM

## 2023-10-01 DIAGNOSIS — J40 Bronchitis, not specified as acute or chronic: Secondary | ICD-10-CM

## 2023-10-01 DIAGNOSIS — J069 Acute upper respiratory infection, unspecified: Secondary | ICD-10-CM

## 2023-10-01 LAB — BASIC METABOLIC PANEL
Anion gap: 12 (ref 5–15)
BUN: 19 mg/dL (ref 6–20)
CO2: 23 mmol/L (ref 22–32)
Calcium: 8.8 mg/dL — ABNORMAL LOW (ref 8.9–10.3)
Chloride: 97 mmol/L — ABNORMAL LOW (ref 98–111)
Creatinine, Ser: 1.13 mg/dL (ref 0.61–1.24)
GFR, Estimated: >60 mL/min (ref >60)
Glucose, Bld: 109 mg/dL — ABNORMAL HIGH (ref 70–99)
Potassium: 3.7 mmol/L (ref 3.5–5.1)
Sodium: 132 mmol/L — ABNORMAL LOW (ref 135–145)

## 2023-10-01 LAB — CBC
HCT: 40.4 % (ref 39.0–52.0)
Hemoglobin: 14 g/dL (ref 13.0–17.0)
MCH: 30.5 pg (ref 26.0–34.0)
MCHC: 34.7 g/dL (ref 30.0–36.0)
MCV: 88 fL (ref 80.0–100.0)
Platelets: 315 10*3/uL (ref 150–400)
RBC: 4.59 MIL/uL (ref 4.22–5.81)
RDW: 12.7 % (ref 11.5–15.5)
WBC: 7.2 10*3/uL (ref 4.0–10.5)
nRBC: 0 % (ref 0.0–0.2)

## 2023-10-01 LAB — TROPONIN I (HIGH SENSITIVITY): Troponin I (High Sensitivity): 5 ng/L (ref <18)

## 2023-10-01 MED ORDER — PROMETHAZINE-DM 6.25-15 MG/5ML PO SYRP: 5.0000 mL | ORAL_SOLUTION | Freq: Four times a day (QID) | ORAL | 0 refills | Status: DC | PRN

## 2023-10-01 MED ORDER — AMOXICILLIN-POT CLAVULANATE 875-125 MG PO TABS: 1.0000 | ORAL_TABLET | Freq: Two times a day (BID) | ORAL | 0 refills | Status: AC

## 2023-10-01 MED ORDER — IOHEXOL 350 MG/ML SOLN
75.0000 mL | Freq: Once | INTRAVENOUS | Status: AC | PRN
  Administered 2023-10-01: 75 mL via INTRAVENOUS

## 2023-10-01 MED ORDER — IPRATROPIUM BROMIDE 0.06 % NA SOLN: 2.0000 | Freq: Four times a day (QID) | NASAL | 12 refills | Status: DC

## 2023-10-01 MED ORDER — BENZONATATE 100 MG PO CAPS: 200.0000 mg | ORAL_CAPSULE | Freq: Three times a day (TID) | ORAL | 0 refills | Status: DC

## 2023-10-01 NOTE — ED Provider Notes (Signed)
[facility] Provider Note    Event Date/Time   First MD Initiated Contact with Patient 10/01/23 2259     (approximate)   History   Chest Pain (Right rib cage )   HPI  Rodney Perry is a 47 y.o. male who presents to the ED for evaluation of Chest Pain (Right rib cage )   I reviewed urgent care visit from earlier today.  History of factor V Leiden and mitral valve prolapse.  Seen for URI and started on Augmentin.  X-ray resulted with concerns for possible small pneumomediastinum he was urged to see [place].   Patient presents at the direction of the urgent care [facility] due to his abnormal x-ray.  Patient reports he has been coughing for like 6 weeks.  Reports he has bronchitis.  Reports right lateral chest wall pain.  No syncope.   Physical Exam   Triage Vital Signs: ED Triage Vitals  Encounter Vitals Group     BP 10/01/23 1916 (!) 135/98     Systolic BP Percentile --      Diastolic BP Percentile --      Pulse Rate 10/01/23 1916 (!) 108     Resp 10/01/23 1916 18     Temp 10/01/23 1916 97.8 F (36.6 C)     Temp Source 10/01/23 1916 Oral     SpO2 10/01/23 1916 99 %     Weight --      Height --      Head Circumference --      Peak Flow --      Pain Score 10/01/23 1918 6     Pain Loc --      Pain Education --      Exclude from Growth Chart --     Most recent vital signs: Vitals:   10/01/23 2318 10/01/23 2330  BP:  (!) 142/87  Pulse: 87 96  Resp: 20 (!) 22  Temp: 98.3 F (36.8 C)   SpO2: 100% 100%    General: Awake, no distress.  Looks well CV:  Good peripheral perfusion.  Resp:  Normal effort.  Clear lungs.  Frequent coughing.  No wheezing Abd:  No distention.  MSK:  No deformity noted.  Localized tenderness laterally over his rib cage on the right around T6-7. Neuro:  No focal deficits appreciated. Other:     ED Results / Procedures / Treatments   Labs (all labs ordered are listed, but only abnormal results are displayed) Labs  Reviewed  BASIC METABOLIC PANEL - Abnormal; Notable for the following components:      Result Value   Sodium 132 (*)    Chloride 97 (*)    Glucose, Bld 109 (*)    Calcium 8.8 (*)    All other components within normal limits  CBC  TROPONIN I (HIGH SENSITIVITY)    EKG Sinus rhythm with a rate of 87 bpm.  Normal axis and intervals.  No evidence of acute ischemia.  RADIOLOGY Outpatient CXR interpreted by me with questionable pneumomediastinum CTA chest interpreted by me without evidence of acute pathology.  Official radiology report(s): CT Angio Chest PE W and/or Wo Contrast Result Date: 10/01/2023 CLINICAL DATA:  Chest congestion and right-sided rib pain. Concern for pulmonary embolism. Pneumomediastinum suspected on the earlier radiograph. EXAM: CT ANGIOGRAPHY CHEST WITH CONTRAST TECHNIQUE: Multidetector CT imaging of the chest was performed using the standard protocol during bolus administration of intravenous contrast. Multiplanar CT image reconstructions and MIPs were obtained to evaluate  the vascular anatomy. RADIATION DOSE REDUCTION: This exam was performed according to the departmental dose-optimization program which includes automated exposure control, adjustment of the mA and/or kV according to patient size and/or use of iterative reconstruction technique. CONTRAST:  75mL OMNIPAQUE IOHEXOL 350 MG/ML SOLN COMPARISON:  Earlier chest radiograph dated 10/01/2023. FINDINGS: Cardiovascular: There is no cardiomegaly or pericardial effusion. The thoracic aorta is unremarkable. The origins of the great vessels of the aortic arch appear patent. No pulmonary artery embolus identified. Mediastinum/Nodes: No hilar or mediastinal adenopathy. Small hiatal hernia. The esophagus is grossly unremarkable. No mediastinal fluid collection or air. Lungs/Pleura: There is mosaic attenuation of the lungs suggestive of areas of air trapping and indicative of underlying small airway versus small vessel disease. No  consolidative changes. There is no pleural effusion or pneumothorax. The central airways are patent. Upper Abdomen: No acute abnormality. Musculoskeletal: Nondisplaced fracture of the posterior right seventh rib (50/5). Old fracture of the posterior left eighth rib. Review of the MIP images confirms the above findings. IMPRESSION: 1. No CT evidence of pulmonary artery embolus. 2. Nondisplaced fracture of the posterior right seventh rib. No pneumothorax. 3. Findings suggestive of small airway versus small vessel disease. Electronically Signed   By: Elgie Collard M.D.   On: 10/01/2023 23:38   DG Ribs Unilateral W/Chest Right Result Date: 10/01/2023 CLINICAL DATA:  Chest congestion and right-sided rib pain. EXAM: RIGHT RIBS AND CHEST - 3+ VIEW; CHEST  1 VIEW COMPARISON:  Chest radiograph dated 06/12/2015. FINDINGS: No focal consolidation, or pleural effusion. Minimal linear air along the left margin of the mediastinum suspicious for a small pneumomediastinum. A pneumothorax is less likely. The cardiac silhouette is within normal limits. No acute osseous pathology. No displaced rib fractures. IMPRESSION: 1. No focal consolidation. 2. Findings suspicious for a small pneumomediastinum. 3. No acute displaced rib fractures. Electronically Signed   By: Elgie Collard M.D.   On: 10/01/2023 18:05   DG Chest 1 View Result Date: 10/01/2023 CLINICAL DATA:  Chest congestion and right-sided rib pain. EXAM: RIGHT RIBS AND CHEST - 3+ VIEW; CHEST  1 VIEW COMPARISON:  Chest radiograph dated 06/12/2015. FINDINGS: No focal consolidation, or pleural effusion. Minimal linear air along the left margin of the mediastinum suspicious for a small pneumomediastinum. A pneumothorax is less likely. The cardiac silhouette is within normal limits. No acute osseous pathology. No displaced rib fractures. IMPRESSION: 1. No focal consolidation. 2. Findings suspicious for a small pneumomediastinum. 3. No acute displaced rib fractures.  Electronically Signed   By: Elgie Collard M.D.   On: 10/01/2023 18:05    PROCEDURES and INTERVENTIONS:  .1-3 Lead EKG Interpretation  Performed by: Delton Prairie, MD Authorized by: Delton Prairie, MD     Interpretation: normal     ECG rate:  92   ECG rate assessment: normal     Rhythm: sinus rhythm     Ectopy: none     Conduction: normal     Medications  iohexol (OMNIPAQUE) 350 MG/ML injection 75 mL (75 mLs Intravenous Contrast Given 10/01/23 2321)  chlorpheniramine-HYDROcodone (TUSSIONEX) 10-8 MG/5ML suspension 5 mL (5 mLs Oral Given 10/02/23 0052)  ketorolac (TORADOL) 30 MG/ML injection 15 mg (15 mg Intravenous Given 10/02/23 0052)     IMPRESSION / MDM / ASSESSMENT AND PLAN / ED COURSE  I reviewed the triage vital signs and the nursing notes.  Differential diagnosis includes, but is not limited to, pneumothorax, pneumomediastinum, pneumonia, bronchitis, rib fracture  {Patient presents with symptoms of an acute illness or  injury that is potentially life-threatening.  Patient presents due to abnormal outpatient CXR with evidence of a singular rib fracture suitable for outpatient management in the setting of bronchitis.  Presenting tachycardia is noted and self resolves.  He looks systemically well with clear lungs and no wheezing.  Normal CBC.  Marginal hyponatremia.  Negative troponin.  CTA chest, as above, with a nondisplaced rib fracture but no signs of pneumomediastinum, pneumothorax, PE or other derangements.  Right hand cough syrup, symptomatic measures for his rib fracture, discussed return precautions.  He is suitable for outpatient management.      FINAL CLINICAL IMPRESSION(S) / ED DIAGNOSES   Final diagnoses:  Other chest pain  Closed fracture of one rib of right side, initial encounter  Bronchitis     Rx / DC Orders   ED Discharge Orders          Ordered    lidocaine (LIDODERM) 5 %  Every 12 hours        10/02/23 0045             Note:  This  document was prepared using Dragon voice recognition software and may include unintentional dictation errors.   Delton Prairie, MD 10/02/23 [phone]

## 2023-10-01 NOTE — ED Triage Notes (Signed)
Pt presents to ER from UC for poss CT to r/o small pneumomediastinum seen on CXR.  Pt states he is having some right side rib cage pain at this time and it is worse when coughing.  Pt is otherwise A&O x4 and in NAD at this time.

## 2023-10-01 NOTE — ED Triage Notes (Signed)
Patient c/o and chest congestion for 2 weeks.  Patient reports right sided rib pain for the past 3 days.  Patient thinks he might have a rib fracture from coughing so much.  Patient denies

## 2023-10-01 NOTE — ED Provider Notes (Addendum)
[facility]    CSN: [id number] Arrival date & time: 10/01/23  1618      History   Chief Complaint Chief Complaint  Patient presents with   Cough    HPI Rodney Perry is a 47 y.o. male.   HPI  64-year-old male with a past medical history significant for MVP, factor V Leiden, back pain, and anxiety presents for evaluation of 2 weeks worth of respiratory symptoms to include runny nose and nasal congestion, cough that is productive for a white and green sputum, and wheezing.  He states that 1 day in the past 2 weeks he had a fever but only last for 24 hours and has not returned.  He denies any sore throat, ear pain, or shortness of breath.  He is complaining of pain on the right side of his rib cage that started 3 days ago that he thinks is related to his coughing.  Past Medical History:  Diagnosis Date   Anxiety    Back pain    Factor V Leiden (HCC)    Mitral valve prolapse     There are no active problems to display for this patient.   Past Surgical History:  Procedure Laterality Date   INGUINAL HERNIA REPAIR         Home Medications    Prior to Admission medications   Medication Sig Start Date End Date Taking? Authorizing Provider  amoxicillin-clavulanate (AUGMENTIN) 875-125 MG tablet Take 1 tablet by mouth every 12 (twelve) hours for 7 days. 10/01/23 10/08/23 Yes Becky Augusta, NP  benzonatate (TESSALON) 100 MG capsule Take 2 capsules (200 mg total) by mouth every 8 (eight) hours. 10/01/23  Yes Becky Augusta, NP  ipratropium (ATROVENT) 0.06 % nasal spray Place 2 sprays into both nostrils 4 (four) times daily. 10/01/23  Yes Becky Augusta, NP  potassium chloride SA (KLOR-CON M) 20 MEQ tablet Take 1 tablet by mouth daily. 07/05/23  Yes [provider]  promethazine-dextromethorphan (PROMETHAZINE-DM) 6.25-15 MG/5ML syrup Take 5 mLs by mouth 4 (four) times daily as needed. 10/01/23  Yes Becky Augusta, NP  hydrochlorothiazide (HYDRODIURIL) 25 MG tablet Take  25 mg by mouth every morning.    [provider]  losartan (COZAAR) 50 MG tablet Take 50 mg by mouth daily.    [provider]    Family History History reviewed. No pertinent family history.  Social History Social History   Tobacco Use   Smoking status: Former   Smokeless tobacco: Never  Advertising account planner   Vaping status: Never Used  Substance Use Topics   Alcohol use: No   Drug use: No     Allergies   Patient has no known allergies.   Review of Systems Review of Systems  Constitutional:  Positive for fever.  HENT:  Positive for congestion and rhinorrhea. Negative for ear pain and sore throat.   Respiratory:  Positive for cough and wheezing. Negative for shortness of breath.   Cardiovascular:  Positive for chest pain.       Right-sided chest wall pain     Physical Exam Triage Vital Signs ED Triage Vitals  Encounter Vitals Group     BP 10/01/23 1714 131/88     Systolic BP Percentile --      Diastolic BP Percentile --      Pulse Rate 10/01/23 1714 97     Resp 10/01/23 1714 15     Temp 10/01/23 1714 98.4 F (36.9 C)     Temp Source 10/01/23  1714 Oral     SpO2 10/01/23 1714 100 %     Weight 10/01/23 1712 134 lb 14.7 oz (61.2 kg)     Height 10/01/23 1712 5\' 5"  (1.651 m)     Head Circumference --      Peak Flow --      Pain Score 10/01/23 1712 5     Pain Loc --      Pain Education --      Exclude from Growth Chart --    No data found.  Updated Vital Signs BP 131/88 (BP Location: Left Arm)   Pulse 97   Temp 98.4 F (36.9 C) (Oral)   Resp 15   Ht 5\' 5"  (1.651 m)   Wt 134 lb 14.7 oz (61.2 kg)   SpO2 100%   BMI 22.45 kg/m   Visual Acuity Right Eye Distance:   Left Eye Distance:   Bilateral Distance:    Right Eye Near:   Left Eye Near:    Bilateral Near:     Physical Exam Vitals and nursing note reviewed.  Constitutional:      Appearance: Normal appearance. He is not ill-appearing.  HENT:     Head: Normocephalic and atraumatic.      Right Ear: Tympanic membrane, ear canal and external ear normal. There is no impacted cerumen.     Left Ear: Tympanic membrane, ear canal and external ear normal. There is no impacted cerumen.     Nose: Congestion and rhinorrhea present.     Comments: This mucosa is erythematous and edematous with yellow discharge in both nares.    Mouth/Throat:     Mouth: Mucous membranes are moist.     Pharynx: Oropharynx is clear. Posterior oropharyngeal erythema present. No oropharyngeal exudate.     Comments: Erythema to the posterior oropharynx with yellow postnasal drip. Cardiovascular:     Rate and Rhythm: Normal rate and regular rhythm.     Pulses: Normal pulses.     Heart sounds: Normal heart sounds. No murmur heard.    No friction rub. No gallop.  Pulmonary:     Effort: Pulmonary effort is normal.     Breath sounds: Normal breath sounds. No wheezing, rhonchi or rales.  Musculoskeletal:     Cervical back: Normal range of motion and neck supple. No tenderness.  Lymphadenopathy:     Cervical: No cervical adenopathy.  Skin:    General: Skin is warm and dry.     Capillary Refill: Capillary refill takes less than 2 seconds.     Findings: No rash.  Neurological:     General: No focal deficit present.     Mental Status: He is alert and oriented to person, place, and time.      UC Treatments / Results  Labs (all labs ordered are listed, but only abnormal results are displayed) Labs Reviewed - No data to display  EKG   Radiology No results found.  Procedures Procedures (including critical care time)  Medications Ordered in UC Medications - No data to display  Initial Impression / Assessment and Plan / UC Course  I have reviewed the triage vital signs and the nursing notes.  Pertinent labs & imaging results that were available during my care of the patient were reviewed by me and considered in my medical decision making (see chart for details).   Patient is a pleasant,  nontoxic-appearing 47 year old male presenting for evaluation of 2 weeks with the respiratory symptoms as outlined HPI above.  He is  able to speak in full sentence with dyspnea or tachypnea.  Triage respiratory rate is 15 with a room air oxygen saturation of 100%.  He is afebrile with an oral temp of 98.4.  His physical exam does reveal inflammation of his upper respiratory tract with yellow nasal discharge and yellow postnasal drip.  Cardiopulmonary exam reveals clear lung sounds in all fields.  Chest x-ray and right rib films were obtained at triage.  Chest x-ray and right rib films independently reviewed and evaluated by me.  Impression: No evidence of infiltrate or effusion.  Cardiomediastinal silhouette appears normal.  No evidence of rib fracture.  Radiology overread is pending. Radiology impression states no focal consolidation, findings suspicious for small pneumomediastinum, no acute displaced rib fractures.  I will discharge patient home with a diagnosis of URI with cough and congestion and chest wall strain on Augmentin 805 mg twice daily with food for 7 days.  Atrovent nasal spray to open the nasal congestion along Tessalon Perles and Promethazine DM cough syrup for cough and congestion.  I contact the patient via phone to advise him of the small pneumomediastinum and had to leave a voicemail asking him to return a call to the clinic as he did not answer.  This pneumomediastinum may be the result of his increased thoracic pressure as a result of coughing for the past 2 weeks.  However, there is also possibility of soft tissue injury as a result of the patient's coughing.  The patient called back and after speaking with him and verifying his name and date of birth I advised him that there is some area in the middle of his chest cavity which is result of air leaking from the lungs and may represent an underlying soft tissue injury.  I advised him that he should go to the emergency department for a  CT scan of his chest.  He reports that he will go and get his mother and go to [facility] in [place].   Final Clinical Impressions(s) / UC Diagnoses   Final diagnoses:  Chest congestion  URI with cough and congestion  Chest wall muscle strain, initial encounter   Discharge Instructions   None    ED Prescriptions     Medication Sig Dispense Auth. Provider   amoxicillin-clavulanate (AUGMENTIN) 875-125 MG tablet Take 1 tablet by mouth every 12 (twelve) hours for 7 days. 14 tablet Becky Augusta, NP   benzonatate (TESSALON) 100 MG capsule Take 2 capsules (200 mg total) by mouth every 8 (eight) hours. 21 capsule Becky Augusta, NP   ipratropium (ATROVENT) 0.06 % nasal spray Place 2 sprays into both nostrils 4 (four) times daily. 15 mL Becky Augusta, NP   promethazine-dextromethorphan (PROMETHAZINE-DM) 6.25-15 MG/5ML syrup Take 5 mLs by mouth 4 (four) times daily as needed. 118 mL Becky Augusta, NP      PDMP not reviewed this encounter.   Becky Augusta, NP 10/01/23 1804    Becky Augusta, NP 10/01/23 1819    Becky Augusta, NP 10/01/23 [phone]

## 2023-10-02 MED ORDER — LIDOCAINE 5 % EX PTCH
1.0000 | MEDICATED_PATCH | CUTANEOUS | Status: DC
Start: 2023-10-02 — End: 2023-10-02
  Administered 2023-10-02: 1 via TRANSDERMAL
  Filled 2023-10-02: qty 1

## 2023-10-02 MED ORDER — KETOROLAC TROMETHAMINE 30 MG/ML IJ SOLN
15.0000 mg | Freq: Once | INTRAMUSCULAR | Status: AC
Start: 2023-10-02 — End: 2023-10-02
  Administered 2023-10-02: 15 mg via INTRAVENOUS
  Filled 2023-10-02: qty 1

## 2023-10-02 MED ORDER — HYDROCOD POLI-CHLORPHE POLI ER 10-8 MG/5ML PO SUER
5.0000 mL | Freq: Once | ORAL | Status: AC
  Administered 2023-10-02: 5 mL via ORAL
  Filled 2023-10-02: qty 5

## 2023-10-02 MED ORDER — LIDOCAINE 5 % EX PTCH: 1.0000 | MEDICATED_PATCH | Freq: Two times a day (BID) | CUTANEOUS | 0 refills | Status: AC

## 2023-10-02 NOTE — Discharge Instructions (Addendum)
Please take Tylenol and ibuprofen/Advil for your pain.  It is safe to take them together, or to alternate them every few hours.  Take up to 1000mg of Tylenol at a time, up to 4 times per day.  Do not take more than 4000 mg of Tylenol in 24 hours.  For ibuprofen, take 400-600 mg, 3 - 4 times per day.  Please use lidocaine patches at your site of pain.  Apply 1 patch at a time, leave on for 12 hours, then remove for 12 hours.  12 hours on, 12 hours off.  Do not apply more than 1 patch at a time.  

## 2023-10-02 NOTE — ED Notes (Signed)
Patient and family provided with discharge instructions including prescriptions x1 and importance of follow up appt as needed with stated understanding. INT removed, cannula intact, pressure dressing applied. Patient stable and ambulatory with steady even gait on dispo.

## 2024-10-15 ENCOUNTER — Encounter: Payer: Self-pay | Admitting: Emergency Medicine

## 2024-10-15 ENCOUNTER — Ambulatory Visit
Admission: EM | Admit: 2024-10-15 | Discharge: 2024-10-15 | Disposition: A | Discharge status: Home / Self Care | Attending: Family Medicine | Admitting: Family Medicine

## 2024-10-15 DIAGNOSIS — B353 Tinea pedis: Secondary | ICD-10-CM | POA: Diagnosis not present

## 2024-10-15 MED ORDER — CEPHALEXIN 500 MG PO CAPS: 500.0000 mg | ORAL_CAPSULE | Freq: Three times a day (TID) | ORAL | 0 refills | Status: AC

## 2024-10-15 MED ORDER — KETOCONAZOLE 2 % EX CREA: 1.0000 | TOPICAL_CREAM | Freq: Every day | CUTANEOUS | 1 refills | Status: AC

## 2024-10-15 NOTE — ED Triage Notes (Signed)
 Patient c/o blackness under both big toes that started on Saturday.  Patient reports swelling chronic in both feet.

## 2024-10-15 NOTE — Discharge Instructions (Addendum)
 Apply the foot ointment twice a day for 6 weeks. It is important that you keep your feet dry.   Follow up with foot and ankle specialist for further management of your foot or ankle concern.   Follow up with Dr Eva Gay  [address]  [facility] - Podiatry  [phone]   OR  Follow up with [facility] in [place].   Call [phone] [address] [place], [place] [address]

## 2024-10-15 NOTE — ED Provider Notes (Signed)
 " [facility]    CSN: [id number] Arrival date & time: 10/15/24  1417      History   Chief Complaint Chief Complaint  Patient presents with   Leg Swelling    Bilateral big toes    HPI  HPI Rodney Perry is a 49 y.o. male.   Rodney Perry presents for bilateral foot rash that started a while ago. He wrapped his feet due to the fluid leaking from them. He wears crocs often.  He is concerned that he may be septic. No fever.  Reports leg swelling. His cardiologist has not changed his medications.      Past Medical History:  Diagnosis Date   Anxiety    Back pain    Factor V Leiden    Mitral valve prolapse     There are no active problems to display for this patient.   Past Surgical History:  Procedure Laterality Date   INGUINAL HERNIA REPAIR         Home Medications    Prior to Admission medications  Medication Sig Start Date End Date Taking? Authorizing Provider  cephALEXin  (KEFLEX ) 500 MG capsule Take 1 capsule (500 mg total) by mouth 3 (three) times daily. 10/15/24  Yes Rodney Rafalski, DO  ketoconazole  (NIZORAL ) 2 % cream Apply 1 Application topically daily. For 6 weeks 10/15/24 11/26/24 Yes Rodney Abraha, DO  hydrochlorothiazide (HYDRODIURIL) 25 MG tablet Take 25 mg by mouth every morning.    [provider]  losartan (COZAAR) 50 MG tablet Take 50 mg by mouth daily.    [provider]  potassium chloride SA (KLOR-CON M) 20 MEQ tablet Take 1 tablet by mouth daily. 07/05/23   [provider]    Family History History reviewed. No pertinent family history.  Social History Social History[1]   Allergies   Patient has no known allergies.   Review of Systems Review of Systems: egative unless otherwise stated in HPI.      Physical Exam Triage Vital Signs ED Triage Vitals  Encounter Vitals Group     BP 10/15/24 1458 (!) 142/89     Girls Systolic BP Percentile --      Girls Diastolic BP Percentile --      Boys Systolic  BP Percentile --      Boys Diastolic BP Percentile --      Pulse Rate 10/15/24 1458 89     Resp 10/15/24 1458 15     Temp 10/15/24 1458 98.5 F (36.9 C)     Temp Source 10/15/24 1458 Oral     SpO2 10/15/24 1458 95 %     Weight 10/15/24 1457 134 lb 14.7 oz (61.2 kg)     Height 10/15/24 1457 5' 5 (1.651 m)     Head Circumference --      Peak Flow --      Pain Score 10/15/24 1457 5     Pain Loc --      Pain Education --      Exclude from Growth Chart --    No data found.  Updated Vital Signs BP (!) 142/89 (BP Location: Left Arm)   Pulse 89   Temp 98.5 F (36.9 C) (Oral)   Resp 15   Ht 5' 5 (1.651 m)   Wt 61.2 kg   SpO2 95%   BMI 22.45 kg/m   Visual Acuity Right Eye Distance:   Left Eye Distance:   Bilateral Distance:    Right Eye Near:   Left  Eye Near:    Bilateral Near:     Physical Exam GEN: well appearing male in no acute distress  CVS: well perfused  RESP: speaking in full sentences without pause, no respiratory distress  MSK:  Ankle/Foot, bilateral: TTP noted at the plantar surface. No visible erythema, swelling, ecchymosis, or bony deformity. Notable pes planus deformity.  No evidence of tibiotalar deviation; Range of motion is full in all directions. Strength is 5/5 in all directions. No tenderness at the insertion/body/myotendinous junction of the Achilles tendon; No  tenderness on posterior aspects of lateral and medial malleolus; Unremarkable  squeeze; Talar dome non-tender; Unremarkable calcaneal squeeze; No plantar calcaneal tenderness; No tenderness over the navicular prominence or  over cuboid; No pain at base of 5th MT; No tenderness at the distal metatarsals; Able to walk 4 steps. No tibial, mild pedal edema  +onychomycosis of great toes     UC Treatments / Results  Labs (all labs ordered are listed, but only abnormal results are displayed) Labs Reviewed - No data to display  EKG   Radiology No results found.   Procedures Procedures  (including critical care time)  Medications Ordered in UC Medications - No data to display  Initial Impression / Assessment and Plan / UC Course  I have reviewed the triage vital signs and the nursing notes.  Pertinent labs & imaging results that were available during my care of the patient were reviewed by me and considered in my medical decision making (see chart for details).      Pt is a 49 y.o.  male with bilateral feet rash with darkening of his toenails.  He has trace pedal edema but this is not new.   On exam he has bilateral onychomycosis involving most of his toenails with tenia pedis.  Sepsis/SIRS criteria not met.  Reassurance provided.  Topical foot cream and coverage for secondary skin infection sent to the pharmacy.  Follow-up with podiatry.  Contact information for to local offices provided.   Patient to gradually return to normal activities, as tolerated and continue ordinary activities within the limits permitted by pain.  Motrin/Tylenol and Lidocaine  patches PRN for multimodal pain relief. Counseled patient on red flag symptoms and when to seek immediate care. Return and ED precautions given.    Discussed MDM, treatment plan and plan for follow-up with patient who agrees with plan.   Final Clinical Impressions(s) / UC Diagnoses   Final diagnoses:  Tinea pedis of both feet     Discharge Instructions      Apply the foot ointment twice a day for 6 weeks. It is important that you keep your feet dry.   Follow up with foot and ankle specialist for further management of your foot or ankle concern.   Follow up with Dr Rodney Perry  [address]  [facility] - Podiatry  [phone]   OR  Follow up with [facility] in [place].   Call [phone] [address] [place], [place] [address]        ED Prescriptions     Medication Sig Dispense Auth. Provider   cephALEXin  (KEFLEX ) 500 MG capsule Take 1 capsule (500 mg total)  by mouth 3 (three) times daily. 21 capsule Rodney Amory, DO   ketoconazole  (NIZORAL ) 2 % cream Apply 1 Application topically daily. For 6 weeks 90 g Rodney Dershem, DO      PDMP not reviewed this encounter.      [1]  Social History Tobacco Use  Smoking status: Former   Smokeless tobacco: Never  Vaping Use   Vaping status: Never Used  Substance Use Topics   Alcohol use: No   Drug use: No     Kriste Berth, DO 10/18/24 1424  "
